# Patient Record
Sex: Male | Born: 1985 | Race: Black or African American | Hispanic: No | Marital: Single
Health system: Midwestern US, Community
[De-identification: ages and names within clinical notes are randomized; demographics above are authoritative.]

---
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
## 2006-06-15 ENCOUNTER — Emergency Department (HOSPITAL_COMMUNITY): Admission: EM | Admit: 2006-06-15 | Discharge: 2006-06-15 | Payer: Self-pay | Admitting: Emergency Medicine

## 2006-06-26 ENCOUNTER — Emergency Department (HOSPITAL_COMMUNITY): Admission: EM | Admit: 2006-06-26 | Discharge: 2006-06-26 | Payer: Self-pay | Admitting: Family Medicine

## 2006-10-12 ENCOUNTER — Emergency Department (HOSPITAL_COMMUNITY): Admission: EM | Admit: 2006-10-12 | Discharge: 2006-10-12 | Payer: Self-pay | Admitting: Emergency Medicine

## 2010-12-08 LAB — POCT URINE HEMOGLOBIN: Hgb urine dipstick: NEGATIVE

## 2014-10-05 ENCOUNTER — Emergency Department (HOSPITAL_COMMUNITY): Admission: EM | Admit: 2014-10-05 | Payer: Self-pay | Source: Home / Self Care

## 2014-10-05 NOTE — ED Notes (Signed)
Pt accidentally registered, pt does not need to be seen, only requires blood draw with [employer]. Will dismiss.

## 2014-10-21 ENCOUNTER — Emergency Department (HOSPITAL_COMMUNITY): Payer: BLUE CROSS/BLUE SHIELD

## 2014-10-21 ENCOUNTER — Encounter (HOSPITAL_COMMUNITY): Payer: Self-pay

## 2014-10-21 ENCOUNTER — Inpatient Hospital Stay (HOSPITAL_COMMUNITY)
Admission: EM | Admit: 2014-10-21 | Discharge: 2014-10-26 | DRG: 199 | Disposition: A | Discharge status: Home / Self Care | Payer: BLUE CROSS/BLUE SHIELD | Attending: General Surgery | Admitting: General Surgery

## 2014-10-21 DIAGNOSIS — J939 Pneumothorax, unspecified: Secondary | ICD-10-CM

## 2014-10-21 DIAGNOSIS — W3400XA Accidental discharge from unspecified firearms or gun, initial encounter: Secondary | ICD-10-CM

## 2014-10-21 DIAGNOSIS — S272XXA Traumatic hemopneumothorax, initial encounter: Secondary | ICD-10-CM | POA: Diagnosis not present

## 2014-10-21 DIAGNOSIS — S22009A Unspecified fracture of unspecified thoracic vertebra, initial encounter for closed fracture: Secondary | ICD-10-CM | POA: Diagnosis present

## 2014-10-21 DIAGNOSIS — S21442A Puncture wound with foreign body of left back wall of thorax with penetration into thoracic cavity, initial encounter: Secondary | ICD-10-CM | POA: Diagnosis present

## 2014-10-21 DIAGNOSIS — S21232A Puncture wound without foreign body of left back wall of thorax without penetration into thoracic cavity, initial encounter: Secondary | ICD-10-CM

## 2014-10-21 DIAGNOSIS — S21239A Puncture wound without foreign body of unspecified back wall of thorax without penetration into thoracic cavity, initial encounter: Secondary | ICD-10-CM

## 2014-10-21 DIAGNOSIS — J942 Hemothorax: Secondary | ICD-10-CM

## 2014-10-21 DIAGNOSIS — Z9689 Presence of other specified functional implants: Secondary | ICD-10-CM

## 2014-10-21 DIAGNOSIS — D62 Acute posthemorrhagic anemia: Secondary | ICD-10-CM | POA: Diagnosis not present

## 2014-10-21 DIAGNOSIS — S2231XA Fracture of one rib, right side, initial encounter for closed fracture: Secondary | ICD-10-CM | POA: Diagnosis present

## 2014-10-21 DIAGNOSIS — S22068A Other fracture of T7-T8 thoracic vertebra, initial encounter for closed fracture: Secondary | ICD-10-CM | POA: Diagnosis present

## 2014-10-21 LAB — CBC
HEMATOCRIT: 51.6 % (ref 39.0–52.0)
HEMOGLOBIN: 17 g/dL (ref 13.0–17.0)
MCH: 30.5 pg (ref 26.0–34.0)
MCHC: 32.9 g/dL (ref 30.0–36.0)
MCV: 92.6 fL (ref 78.0–100.0)
Platelets: 263 10*3/uL (ref 150–400)
RBC: 5.57 MIL/uL (ref 4.22–5.81)
RDW: 13 % (ref 11.5–15.5)
WBC: 10.2 10*3/uL (ref 4.0–10.5)

## 2014-10-21 LAB — CDS SEROLOGY

## 2014-10-21 MED ORDER — FENTANYL CITRATE (PF) 100 MCG/2ML IJ SOLN
50.0000 ug | Freq: Once | INTRAMUSCULAR | Status: AC
  Administered 2014-10-21: 50 ug via INTRAVENOUS

## 2014-10-21 MED ORDER — LIDOCAINE HCL (PF) 2 % IJ SOLN
INTRAMUSCULAR | Status: AC
  Administered 2014-10-21: 10 mL
  Filled 2014-10-21: qty 2

## 2014-10-21 MED ORDER — SODIUM CHLORIDE 0.9 % IV SOLN
Freq: Once | INTRAVENOUS | Status: AC
  Administered 2014-10-21: via INTRAVENOUS

## 2014-10-21 MED ORDER — FENTANYL CITRATE (PF) 100 MCG/2ML IJ SOLN
INTRAMUSCULAR | Status: AC
  Administered 2014-10-21: 50 ug
  Filled 2014-10-21: qty 2

## 2014-10-21 MED ORDER — IOHEXOL 300 MG/ML  SOLN
75.0000 mL | Freq: Once | INTRAMUSCULAR | Status: AC | PRN
  Administered 2014-10-21: 75 mL via INTRAVENOUS

## 2014-10-21 NOTE — ED Notes (Signed)
[facility] bedside 
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
## 2014-10-22 ENCOUNTER — Inpatient Hospital Stay (HOSPITAL_COMMUNITY): Payer: BLUE CROSS/BLUE SHIELD

## 2014-10-22 ENCOUNTER — Encounter (HOSPITAL_COMMUNITY): Payer: Self-pay | Admitting: *Deleted

## 2014-10-22 DIAGNOSIS — S22068A Other fracture of T7-T8 thoracic vertebra, initial encounter for closed fracture: Secondary | ICD-10-CM | POA: Diagnosis present

## 2014-10-22 DIAGNOSIS — S21442A Puncture wound with foreign body of left back wall of thorax with penetration into thoracic cavity, initial encounter: Secondary | ICD-10-CM | POA: Diagnosis present

## 2014-10-22 DIAGNOSIS — S2231XA Fracture of one rib, right side, initial encounter for closed fracture: Secondary | ICD-10-CM | POA: Diagnosis present

## 2014-10-22 DIAGNOSIS — J939 Pneumothorax, unspecified: Secondary | ICD-10-CM | POA: Diagnosis present

## 2014-10-22 DIAGNOSIS — S21239A Puncture wound without foreign body of unspecified back wall of thorax without penetration into thoracic cavity, initial encounter: Secondary | ICD-10-CM

## 2014-10-22 DIAGNOSIS — W3400XA Accidental discharge from unspecified firearms or gun, initial encounter: Secondary | ICD-10-CM

## 2014-10-22 DIAGNOSIS — D62 Acute posthemorrhagic anemia: Secondary | ICD-10-CM | POA: Diagnosis present

## 2014-10-22 DIAGNOSIS — S272XXA Traumatic hemopneumothorax, initial encounter: Secondary | ICD-10-CM | POA: Diagnosis present

## 2014-10-22 LAB — PREPARE FRESH FROZEN PLASMA
UNIT DIVISION: 0
UNIT DIVISION: 0

## 2014-10-22 LAB — BASIC METABOLIC PANEL
ANION GAP: 9 (ref 5–15)
BUN: 13 mg/dL (ref 6–20)
CO2: 25 mmol/L (ref 22–32)
Calcium: 8.5 mg/dL — ABNORMAL LOW (ref 8.9–10.3)
Chloride: 105 mmol/L (ref 101–111)
Creatinine, Ser: 1.44 mg/dL — ABNORMAL HIGH (ref 0.61–1.24)
GFR calc Af Amer: >60 mL/min (ref >60)
GFR calc non Af Amer: >60 mL/min (ref >60)
GLUCOSE: 79 mg/dL (ref 65–99)
POTASSIUM: 4.1 mmol/L (ref 3.5–5.1)
Sodium: 139 mmol/L (ref 135–145)

## 2014-10-22 LAB — CBC
HEMATOCRIT: 45.7 % (ref 39.0–52.0)
Hemoglobin: 15.5 g/dL (ref 13.0–17.0)
MCH: 30 pg (ref 26.0–34.0)
MCHC: 33.9 g/dL (ref 30.0–36.0)
MCV: 88.6 fL (ref 78.0–100.0)
PLATELETS: 221 10*3/uL (ref 150–400)
RBC: 5.16 MIL/uL (ref 4.22–5.81)
RDW: 12.9 % (ref 11.5–15.5)
WBC: 11.2 10*3/uL — AB (ref 4.0–10.5)

## 2014-10-22 LAB — COMPREHENSIVE METABOLIC PANEL
ALBUMIN: 4.5 g/dL (ref 3.5–5.0)
ALK PHOS: 61 U/L (ref 38–126)
ALT: 24 U/L (ref 17–63)
AST: 62 U/L — ABNORMAL HIGH (ref 15–41)
Anion gap: 27 — ABNORMAL HIGH (ref 5–15)
BUN: 16 mg/dL (ref 6–20)
CALCIUM: 9.3 mg/dL (ref 8.9–10.3)
CHLORIDE: 104 mmol/L (ref 101–111)
CO2: 10 mmol/L — AB (ref 22–32)
CREATININE: 1.98 mg/dL — AB (ref 0.61–1.24)
GFR calc Af Amer: 51 mL/min — ABNORMAL LOW (ref >60)
GFR calc non Af Amer: 44 mL/min — ABNORMAL LOW (ref >60)
GLUCOSE: 146 mg/dL — AB (ref 65–99)
Potassium: 3.9 mmol/L (ref 3.5–5.1)
SODIUM: 141 mmol/L (ref 135–145)
Total Bilirubin: 0.6 mg/dL (ref 0.3–1.2)
Total Protein: 7.3 g/dL (ref 6.5–8.1)

## 2014-10-22 LAB — PROTIME-INR
INR: 1.18 (ref 0.00–1.49)
Prothrombin Time: 15.2 seconds (ref 11.6–15.2)

## 2014-10-22 LAB — ABO/RH: ABO/RH(D): O POS

## 2014-10-22 LAB — MRSA PCR SCREENING: MRSA by PCR: NEGATIVE

## 2014-10-22 LAB — BLOOD PRODUCT ORDER (VERBAL) VERIFICATION

## 2014-10-22 LAB — ETHANOL: Alcohol, Ethyl (B): <5 mg/dL (ref <5)

## 2014-10-22 MED ORDER — ENOXAPARIN SODIUM 40 MG/0.4ML ~~LOC~~ SOLN
40.0000 mg | SUBCUTANEOUS | Status: DC
  Administered 2014-10-22 – 2014-10-25 (×4): 40 mg via SUBCUTANEOUS
  Filled 2014-10-22 (×5): qty 0.4

## 2014-10-22 MED ORDER — HYDROMORPHONE HCL 1 MG/ML IJ SOLN
0.5000 mg | INTRAMUSCULAR | Status: DC | PRN
  Administered 2014-10-22 – 2014-10-24 (×12): 1 mg via INTRAVENOUS
  Filled 2014-10-22 (×12): qty 1

## 2014-10-22 MED ORDER — OXYCODONE HCL 5 MG PO TABS
5.0000 mg | ORAL_TABLET | ORAL | Status: DC | PRN
  Administered 2014-10-22 – 2014-10-23 (×2): 5 mg via ORAL
  Filled 2014-10-22 (×4): qty 1

## 2014-10-22 MED ORDER — PANTOPRAZOLE SODIUM 40 MG IV SOLR
40.0000 mg | Freq: Every day | INTRAVENOUS | Status: DC
  Administered 2014-10-22: 40 mg via INTRAVENOUS
  Filled 2014-10-22 (×2): qty 40

## 2014-10-22 MED ORDER — OXYCODONE HCL 5 MG PO TABS
10.0000 mg | ORAL_TABLET | ORAL | Status: DC | PRN
  Administered 2014-10-23: 10 mg via ORAL

## 2014-10-22 MED ORDER — ONDANSETRON HCL 4 MG/2ML IJ SOLN: 4.0000 mg | Freq: Four times a day (QID) | INTRAMUSCULAR | Status: DC | PRN

## 2014-10-22 MED ORDER — ONDANSETRON HCL 4 MG PO TABS: 4.0000 mg | ORAL_TABLET | Freq: Four times a day (QID) | ORAL | Status: DC | PRN

## 2014-10-22 MED ORDER — PANTOPRAZOLE SODIUM 40 MG PO TBEC
40.0000 mg | DELAYED_RELEASE_TABLET | Freq: Every day | ORAL | Status: DC
  Administered 2014-10-23 – 2014-10-24 (×2): 40 mg via ORAL
  Filled 2014-10-22 (×2): qty 1

## 2014-10-22 MED ORDER — KCL IN DEXTROSE-NACL 20-5-0.45 MEQ/L-%-% IV SOLN
INTRAVENOUS | Status: DC
Start: 2014-10-22 — End: 2014-10-25
  Administered 2014-10-22 – 2014-10-24 (×5): via INTRAVENOUS
  Administered 2014-10-25: 1 mL via INTRAVENOUS
  Filled 2014-10-22 (×8): qty 1000

## 2014-10-22 MED ORDER — ACETAMINOPHEN 325 MG PO TABS
650.0000 mg | ORAL_TABLET | ORAL | Status: DC | PRN
  Administered 2014-10-23: 650 mg via ORAL
  Filled 2014-10-22: qty 2

## 2014-10-22 NOTE — ED Notes (Signed)
NRB removed and RA sat 91%  [place] applied 2L
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
## 2014-10-23 LAB — TYPE AND SCREEN
ABO/RH(D): O POS
ANTIBODY SCREEN: NEGATIVE
Unit division: 0
Unit division: 0

## 2014-10-23 MED ORDER — OXYCODONE HCL 5 MG PO TABS
5.0000 mg | ORAL_TABLET | ORAL | Status: DC | PRN
  Administered 2014-10-23: 10 mg via ORAL
  Administered 2014-10-23: 15 mg via ORAL
  Administered 2014-10-24 – 2014-10-25 (×2): 10 mg via ORAL
  Administered 2014-10-25 (×2): 15 mg via ORAL
  Administered 2014-10-26: 10 mg via ORAL
  Administered 2014-10-26: 15 mg via ORAL
  Filled 2014-10-23: qty 2
  Filled 2014-10-23: qty 3
  Filled 2014-10-23 (×2): qty 2
  Filled 2014-10-23 (×4): qty 3

## 2014-10-23 MED ORDER — METHOCARBAMOL 500 MG PO TABS
500.0000 mg | ORAL_TABLET | Freq: Four times a day (QID) | ORAL | Status: DC | PRN
  Administered 2014-10-23 – 2014-10-26 (×4): 500 mg via ORAL
  Filled 2014-10-23 (×5): qty 1
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
## 2014-10-24 ENCOUNTER — Inpatient Hospital Stay (HOSPITAL_COMMUNITY): Payer: BLUE CROSS/BLUE SHIELD

## 2014-10-24 LAB — BASIC METABOLIC PANEL
Anion gap: 8 (ref 5–15)
BUN: <5 mg/dL — ABNORMAL LOW (ref 6–20)
CALCIUM: 8.4 mg/dL — AB (ref 8.9–10.3)
CHLORIDE: 105 mmol/L (ref 101–111)
CO2: 23 mmol/L (ref 22–32)
CREATININE: 1.07 mg/dL (ref 0.61–1.24)
GFR calc Af Amer: >60 mL/min (ref >60)
GFR calc non Af Amer: >60 mL/min (ref >60)
GLUCOSE: 103 mg/dL — AB (ref 65–99)
Potassium: 3.9 mmol/L (ref 3.5–5.1)
Sodium: 136 mmol/L (ref 135–145)

## 2014-10-24 LAB — CBC
HEMATOCRIT: 43 % (ref 39.0–52.0)
HEMOGLOBIN: 14.1 g/dL (ref 13.0–17.0)
MCH: 29.7 pg (ref 26.0–34.0)
MCHC: 32.8 g/dL (ref 30.0–36.0)
MCV: 90.5 fL (ref 78.0–100.0)
Platelets: 190 10*3/uL (ref 150–400)
RBC: 4.75 MIL/uL (ref 4.22–5.81)
RDW: 13 % (ref 11.5–15.5)
WBC: 5.9 10*3/uL (ref 4.0–10.5)
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
## 2014-10-25 ENCOUNTER — Inpatient Hospital Stay (HOSPITAL_COMMUNITY): Payer: BLUE CROSS/BLUE SHIELD

## 2014-10-25 DIAGNOSIS — S2231XA Fracture of one rib, right side, initial encounter for closed fracture: Secondary | ICD-10-CM | POA: Diagnosis present

## 2014-10-25 DIAGNOSIS — S272XXA Traumatic hemopneumothorax, initial encounter: Secondary | ICD-10-CM | POA: Diagnosis present

## 2014-10-25 DIAGNOSIS — S22009A Unspecified fracture of unspecified thoracic vertebra, initial encounter for closed fracture: Secondary | ICD-10-CM | POA: Diagnosis present

## 2014-10-25 DIAGNOSIS — D62 Acute posthemorrhagic anemia: Secondary | ICD-10-CM | POA: Diagnosis not present

## 2014-10-25 MED ORDER — DOCUSATE SODIUM 100 MG PO CAPS
100.0000 mg | ORAL_CAPSULE | Freq: Two times a day (BID) | ORAL | Status: DC
  Administered 2014-10-25 (×2): 100 mg via ORAL
  Filled 2014-10-25 (×2): qty 1

## 2014-10-25 MED ORDER — NAPROXEN 250 MG PO TABS
500.0000 mg | ORAL_TABLET | Freq: Two times a day (BID) | ORAL | Status: DC
  Administered 2014-10-25 – 2014-10-26 (×3): 500 mg via ORAL
  Filled 2014-10-25 (×3): qty 2

## 2014-10-25 MED ORDER — HYDROMORPHONE HCL 1 MG/ML IJ SOLN
0.5000 mg | INTRAMUSCULAR | Status: DC | PRN
  Administered 2014-10-26: 0.5 mg via INTRAVENOUS
  Filled 2014-10-25: qty 1

## 2014-10-25 MED ORDER — POLYETHYLENE GLYCOL 3350 17 G PO PACK
17.0000 g | PACK | Freq: Every day | ORAL | Status: DC
  Administered 2014-10-25: 17 g via ORAL
  Filled 2014-10-25: qty 1
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
## 2014-10-26 ENCOUNTER — Inpatient Hospital Stay (HOSPITAL_COMMUNITY): Payer: BLUE CROSS/BLUE SHIELD

## 2014-10-26 MED ORDER — METHOCARBAMOL 500 MG PO TABS: 500.0000 mg | ORAL_TABLET | Freq: Four times a day (QID) | ORAL | Status: AC | PRN

## 2014-10-26 MED ORDER — NAPROXEN 500 MG PO TABS: 500.0000 mg | ORAL_TABLET | Freq: Two times a day (BID) | ORAL | Status: AC

## 2014-10-26 MED ORDER — OXYCODONE-ACETAMINOPHEN 7.5-325 MG PO TABS: 1.0000 | ORAL_TABLET | ORAL | Status: AC | PRN
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
## 2014-10-26 NOTE — Discharge Instructions (Signed)
Leave dressing in place until Thursday, then Wash wounds daily in shower with soap and water. Do not soak. Apply antibiotic ointment (e.g. Neosporin) twice daily and as needed to keep moist. Cover with dry dressing.  No driving while taking oxycodone.
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
## 2014-10-26 NOTE — Progress Notes (Signed)
Christopher Ayers to be D/C'd Home per MD order.  Discussed with the patient and all questions fully answered.  VSS, Chest tube removed by PA and occlusive dressing applied. Site is dry and intact.  IV catheter discontinued intact. Site without signs and symptoms of complications. Dressing and pressure applied.  An After Visit Summary was printed and given to the patient. Patient received prescriptions.  D/c education completed with patient/family including follow up instructions, medication list, d/c activities limitations if indicated, with other d/c instructions as indicated by MD - patient able to verbalize understanding, all questions fully answered.   Patient instructed to return to ED, call 911, or call MD for any changes in condition.   Patient escorted via WC, and D/C home via private auto.  [name] 10/26/2014 3:24 PM
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
## 2018-11-12 ENCOUNTER — Inpatient Hospital Stay
Admit: 2018-11-12 | Discharge: 2018-11-12 | Disposition: A | Discharge status: Home / Self Care | Payer: BLUE CROSS/BLUE SHIELD | Attending: Emergency Medicine

## 2018-11-12 DIAGNOSIS — K219 Gastro-esophageal reflux disease without esophagitis: Secondary | ICD-10-CM

## 2018-11-12 LAB — CBC WITH AUTOMATED DIFF
ABS. BASOPHILS: 0 10*3/uL (ref 0.0–0.1)
ABS. EOSINOPHILS: 0.3 10*3/uL (ref 0.0–0.4)
ABS. IMM. GRANS.: 0 10*3/uL (ref 0.00–0.04)
ABS. LYMPHOCYTES: 2.4 10*3/uL (ref 0.8–3.5)
ABS. MONOCYTES: 0.7 10*3/uL (ref 0.0–1.0)
ABS. NEUTROPHILS: 3.5 10*3/uL (ref 1.8–8.0)
ABSOLUTE NRBC: 0 10*3/uL (ref 0.00–0.01)
BASOPHILS: 1 % (ref 0–1)
EOSINOPHILS: 4 % (ref 0–7)
HCT: 50.5 % — ABNORMAL HIGH (ref 36.6–50.3)
HGB: 16.5 g/dL (ref 12.1–17.0)
IMMATURE GRANULOCYTES: 1 % — ABNORMAL HIGH (ref 0.0–0.5)
LYMPHOCYTES: 35 % (ref 12–49)
MCH: 28.7 PG (ref 26.0–34.0)
MCHC: 32.7 g/dL (ref 30.0–36.5)
MCV: 88 FL (ref 80.0–99.0)
MONOCYTES: 10 % (ref 5–13)
MPV: 10.5 FL (ref 8.9–12.9)
NEUTROPHILS: 49 % (ref 32–75)
NRBC: 0 PER 100 WBC
PLATELET: 226 10*3/uL (ref 150–400)
RBC: 5.74 M/uL — ABNORMAL HIGH (ref 4.10–5.70)
RDW: 13.4 % (ref 11.5–14.5)
WBC: 6.9 10*3/uL (ref 4.1–11.1)

## 2018-11-12 LAB — METABOLIC PANEL, COMPREHENSIVE
A-G Ratio: 1.2 (ref 1.1–2.2)
ALT (SGPT): 22 U/L (ref 12–78)
AST (SGOT): 15 U/L (ref 15–37)
Albumin: 4.1 g/dL (ref 3.5–5.0)
Alk. phosphatase: 65 U/L (ref 45–117)
Anion gap: 10 mmol/L (ref 5–15)
BUN/Creatinine ratio: 8 — ABNORMAL LOW (ref 12–20)
BUN: 10 MG/DL (ref 6–20)
Bilirubin, total: 0.4 MG/DL (ref 0.2–1.0)
CO2: 30 mmol/L (ref 21–32)
Calcium: 9.9 MG/DL (ref 8.5–10.1)
Chloride: 99 mmol/L (ref 97–108)
Creatinine: 1.3 MG/DL (ref 0.70–1.30)
GFR est AA: >60 mL/min/{1.73_m2} (ref >60)
GFR est non-AA: >60 mL/min/{1.73_m2} (ref >60)
Globulin: 3.3 g/dL (ref 2.0–4.0)
Glucose: 102 mg/dL — ABNORMAL HIGH (ref 65–100)
Potassium: 3.5 mmol/L (ref 3.5–5.1)
Protein, total: 7.4 g/dL (ref 6.4–8.2)
Sodium: 139 mmol/L (ref 136–145)

## 2018-11-12 LAB — LIPASE
Lipase: 285 U/L (ref 73–393)
Lipase: 285 U/L (ref 73–393)

## 2018-11-12 LAB — CBC WITH AUTO DIFFERENTIAL
Basophils %: 1 % (ref 0–1)
Basophils Absolute: 0 10*3/uL (ref 0.0–0.1)
Eosinophils %: 4 % (ref 0–7)
Eosinophils Absolute: 0.3 10*3/uL (ref 0.0–0.4)
Granulocyte Absolute Count: 0 10*3/uL (ref 0.00–0.04)
Hematocrit: 50.5 % — ABNORMAL HIGH (ref 36.6–50.3)
Hemoglobin: 16.5 g/dL (ref 12.1–17.0)
Immature Granulocytes: 1 % — ABNORMAL HIGH (ref 0.0–0.5)
Lymphocytes %: 35 % (ref 12–49)
Lymphocytes Absolute: 2.4 10*3/uL (ref 0.8–3.5)
MCH: 28.7 PG (ref 26.0–34.0)
MCHC: 32.7 g/dL (ref 30.0–36.5)
MCV: 88 FL (ref 80.0–99.0)
MPV: 10.5 FL (ref 8.9–12.9)
Monocytes %: 10 % (ref 5–13)
Monocytes Absolute: 0.7 10*3/uL (ref 0.0–1.0)
NRBC Absolute: 0 10*3/uL (ref 0.00–0.01)
Neutrophils %: 49 % (ref 32–75)
Neutrophils Absolute: 3.5 10*3/uL (ref 1.8–8.0)
Nucleated RBCs: 0 PER 100 WBC
Platelets: 226 10*3/uL (ref 150–400)
RBC: 5.74 M/uL — ABNORMAL HIGH (ref 4.10–5.70)
RDW: 13.4 % (ref 11.5–14.5)
WBC: 6.9 10*3/uL (ref 4.1–11.1)

## 2018-11-12 LAB — COMPREHENSIVE METABOLIC PANEL
ALT: 22 U/L (ref 12–78)
AST: 15 U/L (ref 15–37)
Albumin/Globulin Ratio: 1.2 (ref 1.1–2.2)
Albumin: 4.1 g/dL (ref 3.5–5.0)
Alkaline Phosphatase: 65 U/L (ref 45–117)
Anion Gap: 10 mmol/L (ref 5–15)
BUN: 10 MG/DL (ref 6–20)
Bun/Cre Ratio: 8 — ABNORMAL LOW (ref 12–20)
CO2: 30 mmol/L (ref 21–32)
Calcium: 9.9 MG/DL (ref 8.5–10.1)
Chloride: 99 mmol/L (ref 97–108)
Creatinine: 1.3 MG/DL (ref 0.70–1.30)
EGFR IF NonAfrican American: >60 mL/min/{1.73_m2} (ref >60)
GFR African American: >60 mL/min/{1.73_m2} (ref >60)
Globulin: 3.3 g/dL (ref 2.0–4.0)
Glucose: 102 mg/dL — ABNORMAL HIGH (ref 65–100)
Potassium: 3.5 mmol/L (ref 3.5–5.1)
Sodium: 139 mmol/L (ref 136–145)
Total Bilirubin: 0.4 MG/DL (ref 0.2–1.0)
Total Protein: 7.4 g/dL (ref 6.4–8.2)

## 2018-11-12 MED ORDER — PANTOPRAZOLE 40 MG IV SOLR
40 mg | INTRAVENOUS | Status: AC
Start: 2018-11-12 — End: 2018-11-12
  Administered 2018-11-12: 08:00:00 via INTRAVENOUS

## 2018-11-12 MED ORDER — MORPHINE 4 MG/ML INTRAVENOUS SOLUTION
4 mg/mL | Freq: Once | INTRAVENOUS | Status: AC
Start: 2018-11-12 — End: 2018-11-12
  Administered 2018-11-12: 08:00:00 via INTRAVENOUS

## 2018-11-12 MED ORDER — ONDANSETRON (PF) 4 MG/2 ML INJECTION
4 mg/2 mL | INTRAMUSCULAR | Status: AC
Start: 2018-11-12 — End: 2018-11-12
  Administered 2018-11-12: 08:00:00 via INTRAVENOUS

## 2018-11-12 MED ORDER — PANTOPRAZOLE 40 MG TAB, DELAYED RELEASE
40 mg | ORAL_TABLET | Freq: Every day | ORAL | 0 refills | Status: AC
Start: 2018-11-12 — End: 2018-12-02

## 2018-11-12 MED FILL — PANTOPRAZOLE 40 MG IV SOLR: 40 mg | INTRAVENOUS | Qty: 40

## 2018-11-12 MED FILL — ONDANSETRON (PF) 4 MG/2 ML INJECTION: 4 mg/2 mL | INTRAMUSCULAR | Qty: 4

## 2018-11-12 MED FILL — MORPHINE 4 MG/ML INTRAVENOUS SOLUTION: 4 mg/mL | INTRAVENOUS | Qty: 1

## 2018-11-12 NOTE — ED Notes (Signed)
Pt is sleeping with girlfriend at bedside.

## 2018-11-12 NOTE — ED Provider Notes (Signed)
EMERGENCY DEPARTMENT HISTORY AND PHYSICAL EXAM    Please note that this dictation was completed with Dragon, the computer voice recognition software. Quite often unanticipated grammatical, syntax, homophones, and other interpretive errors are inadvertently transcribed by the computer software. Please disregard these errors.  Please excuse any errors that have escaped final proofreading.    Date: 11/12/2018  Patient Name: Jason Cervantes  Patient Age and Sex: 33 y.o. male    History of Presenting Illness     Chief Complaint   Patient presents with   ??? Abdominal Pain       History Provided By: Patient    HPI: Jason Cervantes, is a 33 y.o. male  Who has no significant medical history presents to the ED with burning epigastric abdominal pain that started this evening about 1 hour post dinner. He ate a burrito. He has had similar pain after eating in the past, but today is more severe than usual. No history of pancreatitis. No known history of gallstones. Pain is epigastric, constant, not radiating. Was initially severe, now more moderate. No vomiting or nausea. Normal BMs. No cp, sob, palpitations.      Pt denies any other alleviating or exacerbating factors. No other associated signs or symptoms. There are no other complaints, changes or physical findings at this time.     PCP: Henry Russel, MD    Past History   All documented elements of the [place] reviewed and verified by me. -Charolotte Capuchin, MD    Past Medical History:  History reviewed. No pertinent past medical history.    Past Surgical History:  History reviewed. No pertinent surgical history.    Family History:  History reviewed. No pertinent family history.    Social History:  Social History     Tobacco Use   ??? Smoking status: Current Some Day Smoker   ??? Smokeless tobacco: Never Used   ??? Tobacco comment: occasional   Substance Use Topics   ??? Alcohol use: Yes     Comment: occasional   ??? Drug use: Yes     Frequency: 14.0 times per week     Types: Marijuana      Comment: 2 blunts       Allergies:  No Known Allergies    Review of Systems   All other systems reviewed and negative  Constitutional: No fever, normal appetite  Eyes: No eye pain or vision changes  ENT: No congestion, no sore throat   Cardiovascular: No chest pain, no palpitations  Respiratory: No cough or SOB  Gastrointestinal: No vomiting or diarrhea,epigastric abdominal pain  Genitourinary: No dysuria or hematuria  Musculoskeletal: No joint pain or swelling, extremities not tender  Hematologic/Lymphatic: No palpable or tender lymphadenopathy, no bleeding tendency  Neurological: No numbness no focal weakness      Physical Exam   Reviewed patients vital signs and nursing note  Constitutional: alert, no acute distress  Eyes: EOMI, normal conjunctiva, PERRLA  ENT: moist mucous membranes, no nasal congestion  Neck: Active, full ROM of neck, no tenderness to palpation   Skin: No rashes, no ecchymosis          Respiratory: Equal chest expansion. Normal work of breathing. Lungs clear to auscultation.  Cardiovascular: Regular rate and rhythm. Equal and normal pulses in all extremities, no peripheral edema    Gastrointestinal: abdomen non distended, soft, tender in epigastrium to deep palpation. No rebound, guarding or signs of peritonitis. No RUQ tenderness, negative murphy's.  MSK: Full, active ROM  in all 4 extremities, no midline back pain  Neurologic: alert and oriented at patient's baseline, normal speech; no unilateral or focal weakness  Psych: Cooperative with exam; Appropriate mood and affect       Diagnostic Study Results     Labs - I have personally reviewed and interpreted all laboratory results. Charolotte Capuchin, MD, MSc  Recent Results (from the past 100 hour(s))   CBC WITH AUTOMATED DIFF    Collection Time: 11/12/18  3:29 AM   Result Value Ref Range    WBC 6.9 4.1 - 11.1 K/uL    RBC 5.74 (H) 4.10 - 5.70 M/uL    HGB 16.5 12.1 - 17.0 g/dL    HCT 50.5 (H) 36.6 - 50.3 %    MCV 88.0 80.0 - 99.0 FL     MCH 28.7 26.0 - 34.0 PG    MCHC 32.7 30.0 - 36.5 g/dL    RDW 13.4 11.5 - 14.5 %    PLATELET 226 150 - 400 K/uL    MPV 10.5 8.9 - 12.9 FL    NRBC 0.0 0 PER 100 WBC    ABSOLUTE NRBC 0.00 0.00 - 0.01 K/uL    NEUTROPHILS 49 32 - 75 %    LYMPHOCYTES 35 12 - 49 %    MONOCYTES 10 5 - 13 %    EOSINOPHILS 4 0 - 7 %    BASOPHILS 1 0 - 1 %    IMMATURE GRANULOCYTES 1 (H) 0.0 - 0.5 %    ABS. NEUTROPHILS 3.5 1.8 - 8.0 K/UL    ABS. LYMPHOCYTES 2.4 0.8 - 3.5 K/UL    ABS. MONOCYTES 0.7 0.0 - 1.0 K/UL    ABS. EOSINOPHILS 0.3 0.0 - 0.4 K/UL    ABS. BASOPHILS 0.0 0.0 - 0.1 K/UL    ABS. IMM. GRANS. 0.0 0.00 - 0.04 K/UL    DF AUTOMATED     METABOLIC PANEL, COMPREHENSIVE    Collection Time: 11/12/18  3:29 AM   Result Value Ref Range    Sodium 139 136 - 145 mmol/L    Potassium 3.5 3.5 - 5.1 mmol/L    Chloride 99 97 - 108 mmol/L    CO2 30 21 - 32 mmol/L    Anion gap 10 5 - 15 mmol/L    Glucose 102 (H) 65 - 100 mg/dL    BUN 10 6 - 20 MG/DL    Creatinine 1.30 0.70 - 1.30 MG/DL    BUN/Creatinine ratio 8 (L) 12 - 20      GFR est AA >60 >60 ml/min/1.55m    GFR est non-AA >60 >60 ml/min/1.797m   Calcium 9.9 8.5 - 10.1 MG/DL    Bilirubin, total 0.4 0.2 - 1.0 MG/DL    ALT (SGPT) 22 12 - 78 U/L    AST (SGOT) 15 15 - 37 U/L    Alk. phosphatase 65 45 - 117 U/L    Protein, total 7.4 6.4 - 8.2 g/dL    Albumin 4.1 3.5 - 5.0 g/dL    Globulin 3.3 2.0 - 4.0 g/dL    A-G Ratio 1.2 1.1 - 2.2     LIPASE    Collection Time: 11/12/18  3:29 AM   Result Value Ref Range    Lipase 285 73 - 393 U/L       Radiologic Studies - I have personally reviewed and interpreted all imaging studies and agree with radiology interpretation and report. - Charolotte CapuchinMD, MSc  No orders to display  Medical Decision Making   I am the first provider for this patient.    Records Reviewed: I reviewed our electronic medical record system for any past medical records that were available that may contribute to the  patient's current condition, including their PMH, surgical history, social and family history. Reviewed the nursing notes and vital signs from today's visit. Nursing notes will be reviewed as they become available in realtime while the pt has been in the ED.  Charolotte Capuchin, MD Msc    Vital Signs-Reviewed the patient's vital signs.      Provider Notes (Medical Decision Making):   Overall well appearing patient with benign exam presents with epigastric abd pain. Post-prandial and seems to be slowly improving. No associated symptoms.  Ddx: gerd, pud, gallstones, pancreatitis.  Will get basic labs, control symptoms and reeval      ED Course:   Initial assessment performed. The patients presenting problems have been discussed, and they are in agreement with the care plan formulated and outlined with them.  I have encouraged them to ask questions as they arise throughout their visit.    TOBACCO COUNSELING:   Upon evaluation, pt expressed that they are a current tobacco user. For approximately 10 minutes, pt has been counseled on the dangers of smoking and was encouraged to quit as soon as possible in order to decrease further risks to their health. Suggestions on how to quit smoking were provided. Pt has conveyed their understanding of the risks involved should they continue to use tobacco products.    Progress note:  Patient has been reassessed and reports feeling considerably better, has normal vital signs and feels comfortable going home. I think this is reasonable as no findings today suggest a life-threatening condition.     DISPOSITION: DISCHARGE  The patient's results have been reviewed with patient and available family and/or caregiver. They verbally convey their understanding and agreement of the patient's signs, symptoms, diagnosis, treatment and prognosis and additionally agree to follow up as recommended in the discharge instructions or to return to the Emergency Department should the patient's  condition change prior to their follow-up appointment.   The patient and available family and/or caregiver verbally agree with the care plan and all of their questions have been answered. The discharge instructions have also been provided to the them with educational information regarding the patient's diagnosis as well a list of reasons why the patient would want to return to the ER prior to their follow-up appointment should any concerns arise, the patient's condition change or symptoms worsen.    Charolotte Capuchin, MD, Msc    PLAN:  Discharge Medication List as of 11/12/2018  5:16 AM      START taking these medications    Details   pantoprazole (Protonix) 40 mg tablet Take 1 Tab by mouth daily for 20 days., Normal, Disp-20 Tab,R-0         1.   2.     Follow-up Information     Follow up With Specialties Details Why Contact Info    Henry Russel, MD Family Medicine Call in 3 days  [place] [phone]  [phone]      [facility] EMERGENCY DEPT Emergency Medicine  As needed, If symptoms worsen [place]        3.   Return to ED if worse       I, Raelyn Mora, MD, am the attending of  record for this patient encounter.    Diagnosis     Clinical Impression:   1. Abdominal pain, epigastric    2. Gastroesophageal reflux disease, esophagitis presence not specified        Attestation:  I personally performed the services described in this documentation on this date 11/12/2018 for patient Jason Cervantes.  Charolotte Capuchin, MD

## 2018-11-12 NOTE — ED Notes (Signed)
Patient has been instructed that they have been given morphine which contains opioids, benzodiazepines, or other sedating drugs. Patient is aware that they  will need to refrain from driving or operating heavy machinery after taking this medication.  Patient also instructed that they need to avoid drinking alcohol and using other products containing opioids, benzodiazepines, or other sedating drugs.  Patient verbalized understanding.pt girlfriend Jason Cervantes at bedside
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
## 2018-11-12 NOTE — ED Notes (Signed)
Zanin, MD at bedside reviewing patient's discharge instructions and reviewing medications. Patient ambulatory home with girlfriend. Patient in no apparent distress.

## 2018-11-12 NOTE — ED Notes (Signed)
Discharged by provider.

## 2018-11-12 NOTE — ED Notes (Signed)
Pt arrived to ED via car with c/o epigastric pain x 1 day with n/v.  Pt reports some constiptation. Bowel sounds active in all 4 quadrants. Pt is in no acute distress. Will continue to monitor. See nursing assessment. Safety precautions in place; call light within reach.    Emergency Department Nursing Plan of Care       The Nursing Plan of Care is developed from the Nursing assessment and Emergency Department Attending provider initial evaluation.  The plan of care may be reviewed in the ???ED Provider note???.    The Plan of Care was developed with the following considerations:   Patient / Family readiness to learn indicated by:verbalized understanding  Persons(s) to be included in education: patient  Barriers to Learning/Limitations:No    Signed     Lytle Mal[place], RN    11/12/2018   2:08 AM

## 2018-11-12 NOTE — ED Provider Notes (Signed)
ED Provider Notes by Raelyn Mora, MD at 11/12/18 7846                Author: Raelyn Mora, MD  Service: EMERGENCY  Author Type: Physician       Filed: 11/14/18 0123  Date of Service: 11/12/18 0528  Status: Signed          Editor: Raelyn Mora, MD (Physician)               EMERGENCY DEPARTMENT HISTORY AND PHYSICAL EXAM        Please note that this dictation was completed with Dragon, the computer voice recognition software. Quite often unanticipated grammatical, syntax, homophones, and other interpretive  errors are inadvertently transcribed by the computer software. Please disregard these errors.  Please excuse any errors that have escaped final proofreading.      Date: 11/12/2018   Patient Name: Jason Cervantes   Patient Age and Sex: 33 y.o.  male        History of Presenting Illness          Chief Complaint       Patient presents with        ?  Abdominal Pain           History Provided By: Patient      HPI: Jason Cervantes, is  a 33 y.o. male  Who has no significant  medical history presents to the ED with burning epigastric abdominal pain that started this evening about 1 hour post dinner. He ate a burrito. He has had similar pain after eating in the past, but today is more severe than usual. No history of pancreatitis.  No known history of gallstones. Pain is epigastric, constant, not radiating. Was initially severe, now more moderate. No vomiting or nausea. Normal BMs. No cp, sob, palpitations.         Pt denies any other alleviating or exacerbating factors. No other associated signs or symptoms. There are no other complaints, changes or physical findings at this time.       PCP: Henry Russel, MD        Past History     All documented elements of the [place] reviewed and verified by me. -Charolotte Capuchin, MD      Past Medical History:   History reviewed. No pertinent past medical history.      Past Surgical History:   History reviewed. No pertinent surgical history.      Family History:    History reviewed. No pertinent family history.      Social History:     Social History          Tobacco Use         ?  Smoking status:  Current Some Day Smoker     ?  Smokeless tobacco:  Never Used        ?  Tobacco comment: occasional       Substance Use Topics         ?  Alcohol use:  Yes             Comment: occasional         ?  Drug use:  Yes              Frequency:  14.0 times per week         Types:  Marijuana             Comment: 2 blunts  Allergies:   No Known Allergies        Review of Systems     All other systems reviewed and negative   Constitutional: No fever, normal appetite   Eyes: No eye pain or vision changes   ENT: No congestion, no sore throat    Cardiovascular: No chest pain, no palpitations   Respiratory: No cough or SOB   Gastrointestinal: No vomiting or diarrhea,epigastric abdominal pain   Genitourinary: No dysuria or hematuria   Musculoskeletal: No joint pain or swelling, extremities not tender   Hematologic/Lymphatic: No palpable or tender lymphadenopathy, no bleeding tendency   Neurological: No numbness no focal weakness           Physical Exam     Reviewed patients vital signs and nursing note   Constitutional: alert, no acute distress   Eyes: EOMI, normal conjunctiva, PERRLA   ENT: moist mucous membranes, no nasal congestion   Neck: Active, full ROM of neck, no tenderness to palpation    Skin: No rashes, no ecchymosis           Respiratory: Equal chest expansion. Normal work of breathing. Lungs clear to auscultation.   Cardiovascular: Regular rate and rhythm. Equal and normal pulses in all extremities, no peripheral edema     Gastrointestinal: abdomen non distended, soft, tender in epigastrium to deep palpation. No rebound, guarding or signs of peritonitis. No RUQ tenderness, negative murphy's.   MSK: Full, active ROM in all 4 extremities, no midline back pain   Neurologic: alert and oriented at patient's baseline, normal speech; no unilateral or focal weakness   Psych:  Cooperative with exam; Appropriate mood and affect           Diagnostic Study Results        Labs - I have personally reviewed and interpreted all laboratory results. Charolotte Capuchin, MD, MSc     Recent Results (from the past 100 hour(s))     CBC WITH AUTOMATED DIFF          Collection Time: 11/12/18  3:29 AM         Result  Value  Ref Range            WBC  6.9  4.1 - 11.1 K/uL       RBC  5.74 (H)  4.10 - 5.70 M/uL       HGB  16.5  12.1 - 17.0 g/dL       HCT  50.5 (H)  36.6 - 50.3 %       MCV  88.0  80.0 - 99.0 FL       MCH  28.7  26.0 - 34.0 PG       MCHC  32.7  30.0 - 36.5 g/dL       RDW  13.4  11.5 - 14.5 %       PLATELET  226  150 - 400 K/uL       MPV  10.5  8.9 - 12.9 FL       NRBC  0.0  0 PER 100 WBC       ABSOLUTE NRBC  0.00  0.00 - 0.01 K/uL       NEUTROPHILS  49  32 - 75 %       LYMPHOCYTES  35  12 - 49 %       MONOCYTES  10  5 - 13 %       EOSINOPHILS  4  0 - 7 %  BASOPHILS  1  0 - 1 %       IMMATURE GRANULOCYTES  1 (H)  0.0 - 0.5 %       ABS. NEUTROPHILS  3.5  1.8 - 8.0 K/UL       ABS. LYMPHOCYTES  2.4  0.8 - 3.5 K/UL       ABS. MONOCYTES  0.7  0.0 - 1.0 K/UL       ABS. EOSINOPHILS  0.3  0.0 - 0.4 K/UL       ABS. BASOPHILS  0.0  0.0 - 0.1 K/UL       ABS. IMM. GRANS.  0.0  0.00 - 0.04 K/UL       DF  AUTOMATED          METABOLIC PANEL, COMPREHENSIVE          Collection Time: 11/12/18  3:29 AM         Result  Value  Ref Range            Sodium  139  136 - 145 mmol/L       Potassium  3.5  3.5 - 5.1 mmol/L       Chloride  99  97 - 108 mmol/L       CO2  30  21 - 32 mmol/L       Anion gap  10  5 - 15 mmol/L       Glucose  102 (H)  65 - 100 mg/dL       BUN  10  6 - 20 MG/DL       Creatinine  1.30  0.70 - 1.30 MG/DL       BUN/Creatinine ratio  8 (L)  12 - 20         GFR est AA  >60  >60 ml/min/1.45m       GFR est non-AA  >60  >60 ml/min/1.716m      Calcium  9.9  8.5 - 10.1 MG/DL       Bilirubin, total  0.4  0.2 - 1.0 MG/DL       ALT (SGPT)  22  12 - 78 U/L       AST (SGOT)  15  15 - 37 U/L       Alk.  phosphatase  65  45 - 117 U/L       Protein, total  7.4  6.4 - 8.2 g/dL       Albumin  4.1  3.5 - 5.0 g/dL       Globulin  3.3  2.0 - 4.0 g/dL       A-G Ratio  1.2  1.1 - 2.2         LIPASE          Collection Time: 11/12/18  3:29 AM         Result  Value  Ref Range            Lipase  285  73 - 393 U/L           Radiologic Studies - I have personally reviewed and interpreted all imaging studies and agree with radiology interpretation and report. - TaCharolotte CapuchinMD, MSc     No orders to display                Medical Decision Making     I am the first provider for this patient.      Records Reviewed: I reviewed  our electronic medical record system for any past medical records that were available that may contribute to the  patient's current condition, including their PMH, surgical history, social and family history. Reviewed the nursing notes and vital signs from today's visit. Nursing notes will be reviewed as they become available in realtime while the pt has been in  the ED.   Charolotte Capuchin, MD Msc      Vital Signs-Reviewed the patient's vital signs.         Provider Notes (Medical Decision Making):    Overall well appearing patient with benign exam presents with epigastric abd pain. Post-prandial and seems to be slowly improving. No associated symptoms.   Ddx: gerd, pud, gallstones, pancreatitis.   Will get basic labs, control symptoms and reeval         ED Course:    Initial assessment performed. The patients presenting problems have been discussed, and they are in agreement with the care plan formulated and outlined with them.  I have encouraged them to ask questions as they arise throughout their visit.      TOBACCO COUNSELING:    Upon evaluation, pt expressed that they are a current tobacco user. For approximately 10 minutes, pt has been counseled on the dangers of smoking and was encouraged to quit as soon as possible in order  to decrease further risks to their health. Suggestions on how to quit smoking were  provided. Pt has conveyed their understanding of the risks involved should they continue to use tobacco products.      Progress note:   Patient has been reassessed and reports feeling considerably better, has normal vital signs and feels comfortable going home. I think this is reasonable as no findings today suggest a life-threatening  condition.       DISPOSITION: DISCHARGE   The patient's results have been reviewed with patient and available family and/or caregiver. They verbally convey their understanding and agreement of the patient's signs, symptoms,  diagnosis, treatment and prognosis and additionally agree to follow up as recommended in the discharge instructions or to return to the Emergency Department should the patient's condition change prior to their follow-up appointment.    The patient and available family and/or caregiver verbally agree with the care plan and all of their questions have been answered. The discharge instructions have also been provided to the them with educational information regarding the patient's diagnosis  as well a list of reasons why the patient would want to return to the ER prior to their follow-up appointment should any concerns arise, the patient's condition change or symptoms worsen.      Charolotte Capuchin, MD, Msc      PLAN:     Discharge Medication List as of 11/12/2018  5:16 AM              START taking these medications          Details        pantoprazole (Protonix) 40 mg tablet  Take 1 Tab by mouth daily for 20 days., Normal, Disp-20 Tab,R-0                   1.     2.        Follow-up Information               Follow up With  Specialties  Details  Why  Contact Info              Curlene Dolphin  Lemmie Evens, MD  Family Medicine  Call in 3 days    [place] [id number]   [phone]                 [facility] EMERGENCY DEPT  Emergency Medicine    As needed, If symptoms worsen  [place]             3.   Return to ED if worse           I, Raelyn Mora, MD, am the attending of record for this patient encounter.        Diagnosis        Clinical Impression:       1.  Abdominal pain, epigastric         2.  Gastroesophageal reflux disease, esophagitis presence not specified            Attestation:   I personally performed the services described in this documentation on this date 11/12/2018 for patient Jason Cervantes.  Charolotte Capuchin, MD

## 2018-11-12 NOTE — ED Notes (Signed)
 Pt arrived to ED via car with c/o epigastric pain x 1 day with n/v.  Pt reports some constiptation. Bowel sounds active in all 4 quadrants. Pt is in no acute distress. Will continue to monitor. See nursing assessment. Safety precautions in place; call light within reach.    Emergency Department Nursing Plan of Care       The Nursing Plan of Care is developed from the Nursing assessment and Emergency Department Attending provider initial evaluation.  The plan of care may be reviewed in the "ED Provider note".    The Plan of Care was developed with the following considerations:   Patient / Family readiness to learn indicated ab:czmajopszi understanding  Persons(s) to be included in education: patient  Barriers to Learning/Limitations:No    Signed     Olam Daring, RN    11/12/2018   2:08 AM

## 2018-11-12 NOTE — ED Notes (Signed)
Patient presents to the ED with c/o epigastric pain that started this morning. Pt denies a hx of heart burn. Reported taking Tums and Pepto bismol without relief.

## 2018-11-12 NOTE — ED Notes (Signed)
Zanin, MD at bedside reviewing patient's discharge instructions and reviewing medications. Patient ambulatory home with girlfriend. Patient in no apparent distress.

## 2018-11-12 NOTE — ED Notes (Signed)
Patient has been instructed that they have been given morphine which contains opioids, benzodiazepines, or other sedating drugs. Patient is aware that they  will need to refrain from driving or operating heavy machinery after taking this medication.  Patient also instructed that they need to avoid drinking alcohol and using other products containing opioids, benzodiazepines, or other sedating drugs.  Patient verbalized understanding.pt girlfriend tawanda at bedside

## 2019-06-04 ENCOUNTER — Encounter: Primary: Family Medicine
